# Patient Record
Sex: Female | Born: 1960 | Race: White | Hispanic: No
Health system: Western US, Academic
[De-identification: ages and names within clinical notes are randomized; demographics above are authoritative.]

## PROBLEM LIST (undated history)

## (undated) DIAGNOSIS — K439 Ventral hernia without obstruction or gangrene: Secondary | ICD-10-CM

## (undated) DIAGNOSIS — F419 Anxiety disorder, unspecified: Secondary | ICD-10-CM

## (undated) DIAGNOSIS — N6019 Diffuse cystic mastopathy of unspecified breast: Secondary | ICD-10-CM

## (undated) DIAGNOSIS — D1800 Hemangioma unspecified site: Secondary | ICD-10-CM

## (undated) DIAGNOSIS — T8130XA Disruption of wound, unspecified, initial encounter: Secondary | ICD-10-CM

## (undated) DIAGNOSIS — R7611 Nonspecific reaction to tuberculin skin test without active tuberculosis: Secondary | ICD-10-CM

## (undated) DIAGNOSIS — R011 Cardiac murmur, unspecified: Secondary | ICD-10-CM

## (undated) DIAGNOSIS — J45909 Unspecified asthma, uncomplicated: Secondary | ICD-10-CM

## (undated) DIAGNOSIS — I499 Cardiac arrhythmia, unspecified: Secondary | ICD-10-CM

## (undated) DIAGNOSIS — K5792 Diverticulitis of intestine, part unspecified, without perforation or abscess without bleeding: Secondary | ICD-10-CM

## (undated) DIAGNOSIS — I2510 Atherosclerotic heart disease of native coronary artery without angina pectoris: Secondary | ICD-10-CM

## (undated) DIAGNOSIS — T148XXA Other injury of unspecified body region, initial encounter: Secondary | ICD-10-CM

## (undated) DIAGNOSIS — K449 Diaphragmatic hernia without obstruction or gangrene: Secondary | ICD-10-CM

## (undated) DIAGNOSIS — K219 Gastro-esophageal reflux disease without esophagitis: Secondary | ICD-10-CM

## (undated) DIAGNOSIS — M1990 Unspecified osteoarthritis, unspecified site: Secondary | ICD-10-CM

## (undated) DIAGNOSIS — F1910 Other psychoactive substance abuse, uncomplicated: Secondary | ICD-10-CM

## (undated) DIAGNOSIS — E785 Hyperlipidemia, unspecified: Secondary | ICD-10-CM

## (undated) DIAGNOSIS — I509 Heart failure, unspecified: Secondary | ICD-10-CM

## (undated) DIAGNOSIS — F32A Depression, unspecified: Secondary | ICD-10-CM

## (undated) DIAGNOSIS — E669 Obesity, unspecified: Secondary | ICD-10-CM

## (undated) DIAGNOSIS — F5101 Primary insomnia: Secondary | ICD-10-CM

## (undated) DIAGNOSIS — T300 Burn of unspecified body region, unspecified degree: Secondary | ICD-10-CM

## (undated) DIAGNOSIS — R519 Headache, unspecified: Secondary | ICD-10-CM

## (undated) DIAGNOSIS — I2699 Other pulmonary embolism without acute cor pulmonale: Secondary | ICD-10-CM

## (undated) HISTORY — DX: Other injury of unspecified body region, initial encounter: T14.8XXA

## (undated) HISTORY — DX: Heart failure, unspecified: I50.9

## (undated) HISTORY — DX: Ventral hernia without obstruction or gangrene: K43.9

## (undated) HISTORY — DX: Anxiety disorder, unspecified: F41.9

## (undated) HISTORY — DX: Other psychoactive substance abuse, uncomplicated: F19.10

## (undated) HISTORY — DX: Headache, unspecified: R51.9

## (undated) HISTORY — PX: PR LIG/TRNSXJ FLP TUBE ABDL/VAG APPR UNI/BI: 58600

## (undated) HISTORY — DX: Diffuse cystic mastopathy of unspecified breast: N60.19

## (undated) HISTORY — PX: PR UNLISTED CARDIAC SURGERY: 33999

## (undated) HISTORY — DX: Hyperlipidemia, unspecified: E78.5

## (undated) HISTORY — DX: Gastro-esophageal reflux disease without esophagitis: K21.9

## (undated) HISTORY — DX: Diaphragmatic hernia without obstruction or gangrene: K44.9

## (undated) HISTORY — DX: Burn of unspecified body region, unspecified degree: T30.0

## (undated) HISTORY — DX: Primary insomnia: F51.01

## (undated) HISTORY — DX: Nonspecific reaction to tuberculin skin test without active tuberculosis: R76.11

## (undated) HISTORY — DX: Hemangioma unspecified site: D18.00

## (undated) HISTORY — DX: Cardiac arrhythmia, unspecified: I49.9

## (undated) HISTORY — DX: Other pulmonary embolism without acute cor pulmonale: I26.99

## (undated) HISTORY — DX: Diverticulitis of intestine, part unspecified, without perforation or abscess without bleeding: K57.92

## (undated) HISTORY — DX: Obesity, unspecified: E66.9

## (undated) HISTORY — DX: Cardiac murmur, unspecified: R01.1

## (undated) HISTORY — DX: Depression, unspecified: F32.A

## (undated) HISTORY — DX: Atherosclerotic heart disease of native coronary artery without angina pectoris: I25.10

## (undated) HISTORY — DX: Unspecified osteoarthritis, unspecified site: M19.90

## (undated) HISTORY — PX: HERNIA REPAIR: SHX5222

## (undated) HISTORY — DX: Disruption of wound, unspecified, initial encounter: T81.30XA

## (undated) HISTORY — PX: PR NM CFS LEAK: 6874

## (undated) HISTORY — DX: Unspecified asthma, uncomplicated: J45.909

---

## 2007-04-22 ENCOUNTER — Encounter (HOSPITAL_COMMUNITY): Payer: Medicare Other | Admitting: Surgery

## 2007-04-22 ENCOUNTER — Ambulatory Visit (EMERGENCY_DEPARTMENT_HOSPITAL): Payer: Medicare Other

## 2007-04-22 DIAGNOSIS — W401XXA Explosion of explosive gases, initial encounter: Secondary | ICD-10-CM

## 2007-04-22 DIAGNOSIS — T310 Burns involving less than 10% of body surface: Secondary | ICD-10-CM

## 2007-04-22 DIAGNOSIS — T24209A Burn of second degree of unspecified site of unspecified lower limb, except ankle and foot, initial encounter: Secondary | ICD-10-CM

## 2007-05-16 ENCOUNTER — Encounter (HOSPITAL_BASED_OUTPATIENT_CLINIC_OR_DEPARTMENT_OTHER): Payer: Medicare Other

## 2007-05-16 DIAGNOSIS — T300 Burn of unspecified body region, unspecified degree: Secondary | ICD-10-CM

## 2007-05-26 ENCOUNTER — Ambulatory Visit (HOSPITAL_BASED_OUTPATIENT_CLINIC_OR_DEPARTMENT_OTHER): Payer: Medicare Other

## 2007-06-25 ENCOUNTER — Ambulatory Visit (HOSPITAL_BASED_OUTPATIENT_CLINIC_OR_DEPARTMENT_OTHER): Payer: Medicare Other

## 2007-06-25 DIAGNOSIS — T24299A Burn of second degree of multiple sites of unspecified lower limb, except ankle and foot, initial encounter: Secondary | ICD-10-CM

## 2007-12-24 ENCOUNTER — Ambulatory Visit (HOSPITAL_BASED_OUTPATIENT_CLINIC_OR_DEPARTMENT_OTHER): Payer: Medicare Other | Admitting: Surgery

## 2008-04-12 ENCOUNTER — Encounter (INDEPENDENT_AMBULATORY_CARE_PROVIDER_SITE_OTHER): Payer: Medicaid Other

## 2008-05-12 ENCOUNTER — Ambulatory Visit (INDEPENDENT_AMBULATORY_CARE_PROVIDER_SITE_OTHER): Payer: Medicaid Other

## 2008-05-26 ENCOUNTER — Ambulatory Visit (INDEPENDENT_AMBULATORY_CARE_PROVIDER_SITE_OTHER): Payer: Medicaid Other

## 2010-08-01 ENCOUNTER — Other Ambulatory Visit: Payer: Self-pay

## 2012-09-20 ENCOUNTER — Other Ambulatory Visit: Payer: Self-pay

## 2016-10-31 ENCOUNTER — Other Ambulatory Visit: Payer: Self-pay

## 2018-02-13 ENCOUNTER — Telehealth (INDEPENDENT_AMBULATORY_CARE_PROVIDER_SITE_OTHER): Payer: Self-pay | Admitting: Surgery
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
## 2018-02-25 ENCOUNTER — Encounter (INDEPENDENT_AMBULATORY_CARE_PROVIDER_SITE_OTHER): Payer: Self-pay | Admitting: Surgery

## 2018-02-25 ENCOUNTER — Ambulatory Visit (INDEPENDENT_AMBULATORY_CARE_PROVIDER_SITE_OTHER): Payer: Medicare Other | Admitting: Surgery

## 2018-02-25 VITALS — BP 126/71 | Temp 91.0°F | Ht 66.0 in | Wt 238.0 lb

## 2018-02-25 DIAGNOSIS — Z6838 Body mass index (BMI) 38.0-38.9, adult: Secondary | ICD-10-CM

## 2018-02-25 DIAGNOSIS — J449 Chronic obstructive pulmonary disease, unspecified: Secondary | ICD-10-CM | POA: Insufficient documentation

## 2018-02-25 DIAGNOSIS — Z951 Presence of aortocoronary bypass graft: Secondary | ICD-10-CM | POA: Insufficient documentation

## 2018-02-25 DIAGNOSIS — F3341 Major depressive disorder, recurrent, in partial remission: Secondary | ICD-10-CM | POA: Insufficient documentation

## 2018-02-25 DIAGNOSIS — Z9889 Other specified postprocedural states: Secondary | ICD-10-CM | POA: Insufficient documentation

## 2018-02-25 DIAGNOSIS — E069 Thyroiditis, unspecified: Secondary | ICD-10-CM | POA: Insufficient documentation

## 2018-02-25 DIAGNOSIS — F411 Generalized anxiety disorder: Secondary | ICD-10-CM | POA: Insufficient documentation

## 2018-02-25 DIAGNOSIS — E785 Hyperlipidemia, unspecified: Secondary | ICD-10-CM | POA: Insufficient documentation

## 2018-02-25 DIAGNOSIS — K432 Incisional hernia without obstruction or gangrene: Secondary | ICD-10-CM

## 2018-02-25 DIAGNOSIS — I10 Essential (primary) hypertension: Secondary | ICD-10-CM | POA: Insufficient documentation

## 2018-02-25 DIAGNOSIS — F17200 Nicotine dependence, unspecified, uncomplicated: Secondary | ICD-10-CM | POA: Insufficient documentation

## 2018-02-25 DIAGNOSIS — K5900 Constipation, unspecified: Secondary | ICD-10-CM | POA: Insufficient documentation

## 2018-02-25 DIAGNOSIS — Z7901 Long term (current) use of anticoagulants: Secondary | ICD-10-CM | POA: Insufficient documentation

## 2018-02-25 DIAGNOSIS — I2782 Chronic pulmonary embolism: Secondary | ICD-10-CM | POA: Insufficient documentation

## 2018-02-25 DIAGNOSIS — K439 Ventral hernia without obstruction or gangrene: Secondary | ICD-10-CM | POA: Insufficient documentation

## 2018-02-25 HISTORY — DX: Chronic obstructive pulmonary disease, unspecified: J44.9

## 2018-02-25 HISTORY — DX: Nicotine dependence, unspecified, uncomplicated: F17.200

## 2018-02-25 HISTORY — DX: Ventral hernia without obstruction or gangrene: K43.9

## 2018-02-25 HISTORY — DX: Hyperlipidemia, unspecified: E78.5

## 2018-02-25 NOTE — Progress Notes (Signed)
I, Seward Grater MD,interviewed and examined the patient while overseeing the documentation performed by my Medical Scribe, Amy Block. I have reviewedand revised as necessarythe scribes note andagree withthe documented findings and plan of care.Please refer to the scribe's note above for further detailed information regarding the patient encounter and exam. 02/25/2018 1:17 PM    Sharion Balloon of [facility] [place] [facility]  [address] [place] [address]  [phone] (telephone)  [phone] (fax)

## 2018-02-25 NOTE — Patient Instructions (Signed)
Hernia (Adult)    A hernia can happen when there is a weakness or defect in the wall of the abdomen or groin.Intestines or nearby tissues may move from their usual location and push through the weakness in the wall. This can cause a hernia (bulge) you may see or feel.  Causes and Risk Factors  A hernia may be present at birth. Or it may be caused by the wear and tear of daily living. Certain factors can make a hernia more likely. These can include:   Heavy lifting   Straining, whether from lifting, movement, or constipation   Chronic cough   Injury to the abdominal wall   Excess weight   Pregnancy   Prior surgery   Older age   Family history of hernia  Symptoms  Symptoms of a hernia may come on suddenly. Or they may appear slowly over time. Some common symptoms include:   Bulge in the groin area, around the navel, or in the scrotum (the bulge may get bigger when you stand and go away when you lie down)   Pain or pressure around the bulge   Pain during activities such as lifting, coughing, or sneezing   A feeling of weakness or pressure in the groin   Pain or swelling in the scrotum  Types of hernias  There are different types of hernia. The type you have depends on its location:   Inguinal: This type is in the groin or scrotum. It is more common in men.   Femoral: This type is in the groin, upper thigh (where the leg bends), or labia. It is more common in women.   Ventral: This type is in the abdominal wall.   Umbilical: This type occurs around the navel (belly button).   Incisional: This type occurs at the site of a previous surgery.  The condition of the hernia can help determine how urgently it needs to be treated.   Reducible: It goes back in by itself, or it can be pushed back in.   Irreducible: It cant be pushed back in.   Incarcerated/Strangulated: The intestine is trapped (incarcerated). If this happens, you wont be able to push the bulge back in. If the incarcerated hernia isnt  treated, it may become strangulated. This means the area loses blood supply and the tissue may die.This requires emergency surgery! Treatment is needed right away!  In most cases, a hernia will not heal on its own. Surgery is usually needed to repair the defect in the abdominal wall or groin. Youll be told more about surgery, if needed.  If your symptoms are not severe, treatment may sometimes be delayed. In such cases, regular follow-up visits with the provider will be needed. Youll be asked to keep track of your symptoms and to watch for signs of more serious problems. You may also be given guidelines similar to the home care instructions below.  Home Care  To help keep a hernia from getting worse, you may be advised to:   Avoid heavy lifting and straining as directed.   Take steps to prevent constipation, such as eating more fiber and drinking more water. This may help reduce straining that can occur when having a bowel movement. Reducing straining may help keep your symptoms from getting worse.   Maintain a healthy weight or lose excess weight. This can help reduce strain on abdominal muscles and tissues.   Stop smoking. This can help prevent coughing that may also strain abdominal muscles and tissues.  Follow-up care  Follow up with your healthcare provider, or as directed.If imaging tests were done, they will be reviewed a doctor. You will be told the results and any new findings that may affect your care.  When to seek medical advice  Call your healthcare provider right away if any of these occur:   Hernia hardens, swells, or grows larger   Hernia can no longer be pushed back in   Pain moves to the lower right abdomen (just below the waistline), or spreads to the back  Call 911  Call 911 right away if any of these occur:   Nausea and vomiting   Severe pain, redness, or tenderness in the area near the hernia   Pain worsens quickly and doesnt get better   Inability to have a bowel movement or  pass gas   Fever of 100.36F (38C) or higher   Trouble breathing   Fainting   Rapid heart rate   Vomiting blood   Large amounts of blood in stool  Date Last Reviewed: 03/23/2014   2000-2017 [employer]. [address], [place] [address]. All rights reserved. This information is not intended as a substitute for professional medical care. Always follow your healthcare professional's instructions.

## 2018-02-25 NOTE — Progress Notes (Deleted)
GENERAL SURGERY OUTPATIENT CONSULTATION    CHIEF COMPLAINT: No chief complaint on file.       Kathleen Chambers is a 57 year old female referred by {REFERRAL SOURCE:104691} [facility] for evaluation of ventral hernia.    HISTORY OF PRESENT ILLNESS:  Kathleen Chambers is a 57 year old female who presents with ***     Outside records were reviewed.  Relevant workup to date is summarized below:  Labs: ***   Endoscopy: ***  Imaging: ***  Pathology: ***  Operative reports: ***  Other studies: ***    Past Medical History:   No past medical history on file.   There is no problem list on file for this patient.        Past Surgical History:   No past surgical history on file.     Family and Social History:   family history is not on file.   Social History     Tobacco Use    Smoking status: Not on file   Substance Use Topics    Alcohol use: Not on file    Drug use: Not on file         Active Meds:   No current outpatient medications on file.     No current facility-administered medications for this visit.          Allergies:   Review of patient's allergies indicates:  Allergies not on file   Review of Systems:   ROS:   Constitutional: {ROS - CONSTITUTIONAL:102793::"Negative "}   Eyes: {HCFAEYE:103488::"Negative "}   Ears, Nose, Mouth, Throat: {ROS - EARS/NOSE/THROAT:103489::"Negative "}   Cardiovascular: {ROS - CARDIOVASCULAR:103490::"Negative "}   Respiratory: {ROS - RESPIRATORY:103491::"Negative "}   Gastrointestinal: {ROS - GASTROENTESTINAL:103492::"Negative"}   Genitourinary: {ROS - GENITOURINARY:103493::"Negative"}   Musculoskeletal: {ROS - MUSCULOSKELETAL:103501::"Negative "}   Skin: {ROS - SKIN:103502::"Negative "}   Neurological: {ROS - NEUROLOGICAL:103505::"Negative "}   Psychiatric: {ROS - PSYCH:103504::"Negative "}   Endocrine: {ROS - ENDOCRINE:103902::"Negative "}   Hematologic/Lymphatic: {ROS - HEMATOLOGIC/LYMPHATIC:103506::"Negative"}   Allergic/Immunologic: {ROS - ALLERGIC/IMMUNOLOGIC:103507::"Negative "}      Physical  Exam:   There were no vitals taken for this visit.  General: {GENERAL APPEARANCE:50::"healthy","alert","no distress"}  Skin: {SKIN EXAM:101::"Skin color, texture, turgor normal. No rashes or concerning lesions"}  Head: {HEAD EXAM:301::"Normocephalic. No masses, lesions, tenderness or abnormalities"}  Eyes: {EYE EXAM:201::"Lids/periorbital skin normal","Conjunctivae/corneas clear","PERRL","EOM's intact"}  Ears: {EAR EXAM:322::"External ears normal. Canals clear. TM's normal."}  Nose:{NOSE BRIEF EXAM:316::"normal"}  Oropharynx: {OROPHARYNX EXAM:303::"Lips, mucosa, and tongue normal. Teeth and gums normal.","posterior pharynx without erythema or drainage"}  Neck: {NECK EXAM:304::"supple. No adenopathy. Thyroid symmetric, normal size, without nodules"}  Lungs: {LUNGS BRIEF EXAM:404::"clear to auscultation"}  Heart: {HEART BRIEF EXAM:513::"normal rate, regular rhythm","no murmurs, clicks, or gallops"}  Back: {BACK EXAM:801::"Back symmetric, no deformity; ROM normal; No CVA tenderness."}  Abd: {ABDOMEN EXAM:601::"soft, non-tender. BS normal. No masses or organomegaly"}   Rectal: {RECTAL - INSPECTION:103795::"No hemorrhoids, no skin lesions, normal sphincter tone, no masses."}     ASSESSMENT:  Kathleen Chambers is a 57 year old female with ***    PLAN:   1. ***  2. Follow-up plan: ***       [place] of [facility] [place] [facility]  [address] [place] [address]  [phone] (telephone)  [phone] (fax)      Scribe Attestation:    Portions of this chart were written by Karoline Caldwell, Medical Scribe, on 02/25/2018  with oversight by Dr. Roselee Nova, MD.

## 2018-02-25 NOTE — Telephone Encounter (Addendum)
Called pt to retrieve all information on surgeries/operations since 2010. No answer, left vm to callback. Only known surgeries so far;    2012- Lumbar infusion by Dr. Rona Ravens x 2 (Need medical record)    2015 - Incisional hernia mesh insertion performed at [facility] in [place], [place]. By Dr. Donneta Romberg (Faxed request for medical record)    2017 - Incisional hernia mesh repair at [facility]. [place]. By [place] (Records faxed over)
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
## 2018-02-26 NOTE — Telephone Encounter (Signed)
LVMTCB.    Levester Fresh, MD  [facility] Clinical Support Staff Pool            Hello,     I need all the operative reports since 2010 for this patient please.

## 2018-02-27 NOTE — Telephone Encounter (Addendum)
LVMTCB. Received fax back from [facility] stating there were no OP notes for this pt from 2015.     Kathleen Fresh, MD  [facility] Clinical Support Staff Pool             Hello,     I need all the operative reports since 2010 for this patient please.

## 2018-05-27 ENCOUNTER — Encounter (INDEPENDENT_AMBULATORY_CARE_PROVIDER_SITE_OTHER): Payer: Medicare Other | Admitting: Surgery

## 2018-06-09 ENCOUNTER — Telehealth (INDEPENDENT_AMBULATORY_CARE_PROVIDER_SITE_OTHER): Payer: Self-pay | Admitting: Surgery

## 2018-06-09 ENCOUNTER — Encounter (INDEPENDENT_AMBULATORY_CARE_PROVIDER_SITE_OTHER): Payer: Medicare Other | Admitting: Surgery

## 2018-06-09 ENCOUNTER — Ambulatory Visit: Payer: Self-pay

## 2018-06-09 NOTE — Telephone Encounter (Signed)
Patient called today to add a note to her chart that she is having trouble catching her breath when she walks, is in bed and when she is standing. She experiences relief when she lays on her side. Additionally she has a hoarse voice and trouble with bowl movements.     Patient has already spoken to the community care line through the [facility] for triaging for her symptoms and is aware to present to the ED or urgent care as needed.     Patient is scheduled for an appointment on 06/17/2018. Patient declined earlier appointment due to ride scheduling.

## 2018-06-09 NOTE — Progress Notes (Deleted)
GENERAL SURGERY OUTPATIENT FOLLOW-UP    CHIEF COMPLAINT: No chief complaint on file.       Last Clinic Visit: ***      HISTORY OF PRESENT ILLNESS:  Kathleen Chambers is a 57 year old female who presents for follow up of her large incisional hernia.    Outside records were reviewed.  Relevant workup to date is summarized below:  Labs: ***   Endoscopy: ***  Imaging: ***  Pathology: ***  Operative reports: ***  Other studies: ***    I have *** reviewed the patient's past medical history, family history, medications, allergies, and social history.    ROS: Complete ROS {was/was not:103378} performed and all other systems negative except as in HPI.     PHYSICAL EXAM:   There were no vitals taken for this visit.  General: {GENERAL APPEARANCE:50::"healthy","alert","no distress"}  Skin: {SKIN EXAM:101::"Skin color, texture, turgor normal. No rashes or concerning lesions"}  Head: {HEAD EXAM:301::"Normocephalic. No masses, lesions, tenderness or abnormalities"}  Eyes: {EYE EXAM:201::"Lids/periorbital skin normal","Conjunctivae/corneas clear","PERRL","EOM's intact"}  Ears: {EAR EXAM:322::"External ears normal. Canals clear. TM's normal."}  Nose:{NOSE BRIEF EXAM:316::"normal"}  Oropharynx: {OROPHARYNX EXAM:303::"Lips, mucosa, and tongue normal. Teeth and gums normal.","posterior pharynx without erythema or drainage"}  Neck: {NECK EXAM:304::"supple. No adenopathy. Thyroid symmetric, normal size, without nodules"}  Lungs: {LUNGS BRIEF EXAM:404::"clear to auscultation"}  Heart: {HEART BRIEF EXAM:513::"normal rate, regular rhythm","no murmurs, clicks, or gallops"}  Back: {BACK EXAM:801::"Back symmetric, no deformity; ROM normal; No CVA tenderness."}  Abd: {ABDOMEN EXAM:601::"soft, non-tender. BS normal. No masses or organomegaly"}   Rectal: {RECTAL - INSPECTION:103795::"No hemorrhoids, no skin lesions, normal sphincter tone, no masses."}     ASSESSMENT:  Kathleen Chambers is a 57 year old female with ***    PLAN:   1. ***  2. Follow-up  plan: ***       [place] of [facility] and [place] and [facility]  [address], [place] [address]  [phone] (telephone)  [phone] (fax)      Scribe Attestation:    Portions of this chart were written by Karoline Caldwell, Medical Scribe, on 06/09/2018 with oversight by Dr. Roselee Nova, MD.

## 2018-06-09 NOTE — Telephone Encounter (Signed)
Reason for Disposition   MODERATE longstanding difficulty breathing (e.g., speaks in phrases, SOB even at rest, pulse 100-120) and SAME as normal    Protocols used: BREATHING DIFFICULTY-ADULT-OH    Pt calling to speak with [facility] surgical team. And/or make an appt. She has an abd hernia that is pressing on her diaphram making it difficult to get full breath, especially when lying down. Denies SOB enough to go to ED or 911. Pt has CHF, COPD and knows what that feels like, able to speak in full sentences, no wheezing.

## 2018-06-09 NOTE — Telephone Encounter (Signed)
(  TEXTING IS AN OPTION FOR [facility] ONLY)  Is this a [facility] clinic? No      RETURN CALL: Detailed message on voicemail only      SUBJECT:  Form/Letter/Paperwork Request     REASON FOR REQUEST: transportation     FORM/LETTER/PAPERWORK IS FOR: Other: [employer] (transportation)  WHO SHOULD FILL IT OUT?: Dr Roselee Nova  WILL PATIENT PICK UP?: Please fax to [phone]  NEEDED BY: as soon as possible (scheduled 9/3 for follow up)  ADDITIONAL INFORMATION: patient has been unable to arrange transportation due to PCP sending insufficient information for the need of her transportation from [place] to [facility] for her follow up with Dr Roselee Nova. A letter from Dr Roselee Nova explaining the medical necessity for the ride may help.     Please call [employer] at [phone] to request the form needed and ascertain the information they need in the letter to confirm the patient's ride.     Please call patient to follow up, details on voicemail are okay.

## 2018-06-17 ENCOUNTER — Encounter (INDEPENDENT_AMBULATORY_CARE_PROVIDER_SITE_OTHER): Payer: Medicare Other | Admitting: Surgery

## 2018-07-08 ENCOUNTER — Ambulatory Visit (INDEPENDENT_AMBULATORY_CARE_PROVIDER_SITE_OTHER): Payer: Medicare Other | Admitting: Surgery

## 2018-07-08 VITALS — BP 136/66 | Ht 66.0 in | Wt 238.0 lb

## 2018-07-08 DIAGNOSIS — K432 Incisional hernia without obstruction or gangrene: Secondary | ICD-10-CM

## 2018-07-08 DIAGNOSIS — Z6838 Body mass index (BMI) 38.0-38.9, adult: Secondary | ICD-10-CM

## 2018-07-08 NOTE — Progress Notes (Signed)
GENERAL SURGERY OUTPATIENT FOLLOW-UP    CHIEF COMPLAINT: Follow Up Visit       Last Clinic Visit: 02/25/18    Recap from prior visit:  Kathleen Chambers is a 57 year old female referred by another physician: Dr. Tobie Poet for evaluation of her incisional hernia. She first noticed symptoms 9 years ago located in the upper epigastric region.     She describes the pain associated with her hernia as  intermittent without radiation. It is associated with a a large visible bulge and some occasional nausea Risk factors for increased intra-abdominal pressure include smoking and obesity. The patient doesn't have a history of MRSA, and has had negative nasal swabs in the past.    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 Patient subsequently had surgeries to place mesh in the abdomen, reportedly performed by her CT sugeron, and a surgery with Dr. Radene Knee years later to remove her infect abdominal mesh. She reports that she had been walking to a post-op visit when her chest started "pumping blood out," which prompted a visit to the ED. She had a large blood clot over the abdominal mesh that led to a hematoma and infection. Her abdominal wound remained open for 3 years before finally closing.    She reports that she does not perform many daily activities due to her not being able to lift anything. She also has shortness of breath, worse with exertion, that she attributes to smoking half a pack of cigarettes a day. Patient is on an anticoagulant. She endorses nausea at night when she goes to move positions that feels like a "tearing." She endorses episodes of the hernia turning red and becoming hard before she had her abdominal mesh removed, but none since then.    Patient takes coumadin for her mitral valve repair. She has previously used bridging shots in the past. She denies any diabetes. 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 Patient has not had a  colonoscopy. She reports that she heard she was at a higher risk and would require upper-and-lower endoscopies in the hospital, but she is unclear on when she would eventually get a colonoscopy.    Her sister currently has had lung cancer for 6 years and is not doing well. Both her parents died of lung cancer, and she has other relatives with heart disease.      ECOG performance status: 2      She has had 1 attempt at repair. Repair(s) have consisted of one onlay mesh repair     Functional Status: Independent     Employment type: None     Sports activity: None    Hypertension? yes  Liver disease? no          Ascites? no  Dyspnea? yes               COPD?  yes  Diabetic? No                 Hemoglobin A1c (in last 6 months)? No   Anticoagulated? Yes                 Antiplatelet meds (aspirin, Plavix, Effient, Ticlid)?No     To be held for surgery? No  Immunosuppressants (steroids, antimetabolites, antibody, anti-rejection)? No  History of IBD? No  History of AAA? No  Previous attempts at repair = 1.  Component separation?: no  History of open abdomen (fascia and skin not closed after surgery)? yes   History of abdominal wall surgical site infection? Yes Superficial   Current, active infection?  no  History of MRSA infection? yes      HISTORY OF PRESENT ILLNESS:    Kathleen Chambers is a 57 year old female who returns today for further evaluation of her hernia, a weight check, and check with smoking cessation. There is good news and bad news, the bad news is that her weight loss has not been as good as hoped. She is eating well and trying to exercise, but she has been developing worsening dyspnea and leg swelling. She takes lasix daily, but still has pedal edema. She tells me that she is getting some PFT's done soon. She is feeling more constipated lately, she had to switch to Linzess to help with her bowel function, but it is not working as well as she had hoped. She has been able to greatly reduce her  smoking. In 3 months, she has gone from 2 ppd to 5 cigarettes a day. She is very happy with this result. Otherwise, nothing else has really changed for her. No new nausea or vomiting.    Outside records were reviewed. Relevant workup to date is summarized below:    Labs: NA   Endoscopy: NA  Imaging: NA  Pathology: NA  Operative reports: NA  Other studies: Na    I have personally reviewed the patient's past medical history, family history, medications, allergies, and social history.    ROS: Complete ROS was performed and all other systems negative except as in HPI.    PHYSICAL EXAM:   BP 136/66    Ht 5\' 6"  (1.676 m)    Wt (!) 238 lb (108 kg)    BMI 38.41 kg/m   General: healthy, alert, no distress  Skin: Skin color, texture, turgor normal. No rashes or concerning lesions  Head: Normocephalic. No masses, lesions, tenderness or abnormalities  Eyes: Lids/periorbital skin normal, Conjunctivae/corneas clear, PERRL, EOM's intact  Ears: External ears normal. Canals clear. TM's normal.  Nose:normal  Oropharynx: Lips, mucosa, and tongue normal. Teeth and gums normal., posterior pharynx without erythema or drainage  Neck: supple. No adenopathy. Thyroid symmetric, normal size, without nodules  Lungs: clear to auscultation  Heart: normal rate, regular rhythm and no murmurs, clicks, or gallops  Back: Back symmetric, no deformity; ROM normal; No CVA tenderness.  Abd:  soft, non-tender, with large epigastric hernia extending from her xyphoid process to is tender to palpation. Irreducible secondary to size. Skin is healed over but thin.    Rectal: Deferred.  ?  ASSESSMENT:  Kathleen Chambers is a 57 year old female with a large ventral hernia  PLAN:   1. She needs to continue with weight loss and smoking cessation. She is improving, but we cannot rush this issue. She should follow up with her PCP re: her PFT to make sure she will be a safe candidate for surgery.   2. Follow-up plan: 2 months  ?  [place]  of [facility] and [facility]  Surgical Services and [facility]  [address], [place] [address]  [phone] (telephone)  [phone] (fax)  ?

## 2018-09-03 NOTE — Progress Notes (Signed)
GENERAL SURGERY OUTPATIENT FOLLOW-UP    CHIEF COMPLAINT: Follow Up Visit (Abd hernia f/u, no surgery yet)       Last Clinic Visit: 07/08/2018    Recap from last visit:  Patient initially presented on 02/25/2018 for evaluation of an incisional hernia that she noticed 9 years ago located in the upper epigastric region. She endorsed associated intermittent pain, large visible bulge and occasional nausea. Risk factors for increased intra-abdominal pressure includesmoking and obesity.    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 Patient subsequently had surgeries to place mesh in the abdomen, reportedly performed by her CT surgeon, and a surgery with Dr. Radene Knee years later to remove her infect abdominal mesh. She reports that she had been walking to a post-op visit when her chest started "pumping blood out," which prompted a visit to the ED. She had a large blood clot over the abdominal mesh that led to a hematoma and infection. Her abdominal wound remained open for 3 years before finally closing.    Patient takes coumadin for her mitral valve repair and has previously used bridging shots in the past.  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 She denies any diabetes.Patient has not had a colonoscopy.     Her sister currently has had lung cancer for 6 years and is not doing well. Both her parents died of lung cancer, and she has other relatives with heart disease.    ECOG performance status:2    She has had1attempt at repair. Repair(s) have consisted of one onlay mesh repair    Functional Status:Independent    Employment type:None    Sports activity:None    Hypertension?yes  Liver disease?no  Ascites?no  Dyspnea?yes  COPD?yes  Diabetic?No  Hemoglobin A1c (in last 6 months)?No  Anticoagulated?Yes  Antiplatelet meds (aspirin, Plavix, Effient, Ticlid)?No  To be  held for surgery?No  Immunosuppressants (steroids, antimetabolites, antibody, anti-rejection)?No  History of IBD?No  History of AAA?No  Previous attempts at repair =1.   Component separation?:no  History of open abdomen (fascia and skin not closed after surgery)?yes  History of abdominal wall surgical site infection?YesSuperficial   Current, active infection?no  History of MRSA infection?yes    She again seen on 07/08/2018 when she reported that she developed worsening dyspnea and leg swelling despite taking lasix daily. She also endorsed constipation and being placed on Linzess for her bowel function which has not been helping. She did report that she was able to reduce her smoking from 2 ppd to 5 cigarettes per day. At the time, we discussed following with her PFT as planned with her PCP and continue with weight loss goals and smoking cessation.     HISTORY OF PRESENT ILLNESS:  Kathleen Chambers is a 57 year old female with incisional hernia who returns today for weight check and check on smoking cessation.    She states that she has been gaining weight after trying to quit smoking, which she has been reduce down to 5 cigarettes per day. She states that it is difficult to reduce her smoking below this and she has moody affects. She believes that she will be able to quit smoking completely before Christmas. She denies any recent nausea. She endorses constipation. 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 She is still interested in surgery to  repair her hernia.    I have  reviewed the patient's past medical history, family history, medications, allergies, and social history.    ROS: Complete ROS was performed and all other systems negative except as in HPI.     PHYSICAL EXAM:   BP 119/64    Pulse 76    Ht 5\' 6"  (1.676 m)    Wt (!) 251 lb 3.2 oz (113.9 kg) Comment: Pt in wheelchair   BMI 40.54 kg/m   General:  Obese, alert, no distress  Skin: Skin color, texture, turgor normal. No rashes or concerning lesions  Head: Normocephalic. No masses, lesions, tenderness or abnormalities  Eyes: Lids/periorbital skin normal, Conjunctivae/corneas clear, PERRL, EOM's intact  Ears: External ears normal. Canals clear. TM's normal.  Nose:normal  Oropharynx: Lips, mucosa, and tongue normal. Teeth and gums normal., posterior pharynx without erythema or drainage  Neck: supple. No adenopathy. Thyroid symmetric, normal size, without nodules  Lungs: clear to auscultation  Heart: normal rate, regular rhythm and no murmurs, clicks, or gallops  Back: Back symmetric, no deformity; ROM normal; No CVA tenderness.  Abd: soft, non-tender, with large epigastric hernia extending from her xyphoid process tois tender to palpation. Irreducible secondary to size. Skin is healed over but thin.   Rectal: Deferred      ASSESSMENT:  [name] is a 57 year old female with incisional hernia.    PLAN:   1. I recommended patient to continue with her smoking cessation and weight loss goal of 10 lbs, but to focus more on quitting smoking.  2. I discussed with the patient that she may be sent to a skilled nursing facility after surgery temporarily if the physical therapist at our hospital thinks that she will have difficulty ambulating after surgery.  3. I advised her to call her cardiologist to see if she will need to be seen at cardiology clinic for clearance for surgery.  4. Follow-up plan: Return for another check up in January when we will schedule surgery if she has successfully quit smoking completely.         [place] of [facility] and [facility]  Surgical Services and [facility]  [address], [place] [address]  [phone] (telephone)  [phone] (fax)

## 2018-09-04 ENCOUNTER — Ambulatory Visit (INDEPENDENT_AMBULATORY_CARE_PROVIDER_SITE_OTHER): Payer: Medicare Other | Admitting: Surgery

## 2018-09-04 VITALS — BP 119/64 | HR 76 | Ht 66.0 in | Wt 251.2 lb

## 2018-09-04 DIAGNOSIS — Z6841 Body Mass Index (BMI) 40.0 and over, adult: Secondary | ICD-10-CM

## 2018-09-04 DIAGNOSIS — K432 Incisional hernia without obstruction or gangrene: Secondary | ICD-10-CM

## 2018-09-04 NOTE — Progress Notes (Signed)
I, Seward Grater MD, interviewed and examined the patient while overseeing the documentation performed by my Medical Scribe, Ju Oh. I have reviewed and revised as necessary the scribe's note and agree with the documented findings and plan of care. Please refer to the scribe's note above for further detailed information regarding the patient encounter and exam. 09/04/2018 1:24 PM    [place] Balloon of [facility] and [facility]  Surgical Services and [facility]  [address], [place] [address]  [phone] (telephone)  [phone] (fax)

## 2018-10-30 ENCOUNTER — Encounter (INDEPENDENT_AMBULATORY_CARE_PROVIDER_SITE_OTHER): Payer: Medicare Other | Admitting: Surgery

## 2018-11-12 NOTE — Progress Notes (Signed)
GENERAL SURGERY OUTPATIENT FOLLOW-UP    CHIEF COMPLAINT: Follow Up Visit (Abdominal hernia. More problems going to the bathroom. )       Last Clinic Visit: 09/04/2018    Recap of History:  Patient initially presented on 02/25/2018 for evaluation of an incisional hernia that she noticed 9 years ago located in the upper epigastric region. She endorsed associated intermittent pain, large visible bulge and occasional nausea. Risk factors for increased intra-abdominal pressure includesmoking and obesity.    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 Patient subsequently had surgeries to place mesh in the abdomen, reportedly performed by her CT surgeon, and a surgery with Dr. Radene Knee years later to remove her infect abdominal mesh. She reports that she had been walking to a post-op visit when her chest started "pumping blood out," which prompted a visit to the ED. She had a large blood clot over the abdominal mesh that led to a hematoma and infection. Her abdominal wound remained open for 3 years before finally closing.    Patient takes coumadin for her mitral valve repair and has previously used bridging shots in the past.  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 She denies any diabetes.Patient has not had a colonoscopy.     Her sister currently has had lung cancer for 6 years and is not doing well. Both her parents died of lung cancer, and she has other relatives with heart disease.    ECOG performance status:2    She has had1attempt at repair. Repair(s) have consisted of one onlay mesh repair    Functional Status:Independent    Employment type:None    Sports activity:None    Hypertension?yes  Liver disease?no  Ascites?no  Dyspnea?yes  COPD?yes  Diabetic?No  Hemoglobin A1c (in last 6 months)?No  Anticoagulated?Yes  Antiplatelet meds (aspirin, Plavix, Effient,  Ticlid)?No  To be held for surgery?No  Immunosuppressants (steroids, antimetabolites, antibody, anti-rejection)?No  History of IBD?No  History of AAA?No  Previous attempts at repair =1.   Component separation?:no  History of open abdomen (fascia and skin not closed after surgery)?yes  History of abdominal wall surgical site infection?YesSuperficial   Current, active infection?no  History of MRSA infection?yes    She again seen on 07/08/2018 when she reported that she developed worsening dyspnea and leg swelling despite taking lasix daily. She also endorsed constipation and being placed on Linzess for her bowel function which has not been helping. She did report that she was able to reduce her smoking from 2 ppd to 5 cigarettes per day. At the time, we discussed following with her PFT as planned with her PCP and continue with weight loss goals and smoking cessation.     On 09/04/2018, patient returned to clinic for another check up on her weight loss and smoking cessation efforts. At the time, patient reported the she could not further reduce her smoking from 5 cigarettes a day. Patient was also advised to call her cardiologist to see if she will need to be seen for surgery clearance.    HISTORY OF PRESENT ILLNESS:  Kathleen Chambers is a 58 year old female who returns today for weight check and further discussion of her surgical plans.    Today, patient reports that she has been able to quit smoking completely since first of this month. However, patient's weight is now 262 lbs which is increased from 251 lbs measured at  last visit. She continues to have discomfort from her hernia and she is hoping to have it repaired as soon as possible.    I have reviewed the patient's past medical history, family history, medications, allergies, and social history.    ROS: Complete ROS was performed and all other systems negative except as in HPI.     PHYSICAL EXAM:   BP 124/64   Ht 5\' 6"  (1.676 m)   Wt (!)  262 lb (118.8 kg)   BMI 42.29 kg/m    General: Morbidly obese, alert, no distress  Skin: Skin color, texture, turgor normal. No rashes or concerning lesions  Head: Normocephalic. No masses, lesions, tenderness or abnormalities  Eyes: Lids/periorbital skin normal, Conjunctivae/corneas clear, PERRL, EOM's intact  Ears: External ears normal. Canals clear. TM's normal.  Nose:normal  Oropharynx: Lips, mucosa, and tongue normal. Teeth and gums normal., posterior pharynx without erythema or drainage  Neck: supple. No adenopathy. Thyroid symmetric, normal size, without nodules  Lungs: clear to auscultation  Heart: normal rate, regular rhythm and no murmurs, clicks, or gallops  Back: Back symmetric, no deformity; ROM normal; No CVA tenderness.  Abd: soft, non-tender, with large epigastric hernia extending from her xyphoid process tois tender to palpation. Irreducible secondary to size. Skin is healed over but thin.  Rectal: Deferred     ASSESSMENT:  Kathleen Chambers is a 58 year old female with an incisional hernia.    PLAN:   1. Again I discussed with the patient that an open surgery would be the best option for her hernia, which is not possible without having her BMI down to at least 35. She can undergo a robotic surgery at any time, but patient would like to continue with weight loss efforts so that she can have an open surgery. I am happy that patient was able to quit smoking and I encouraged her to continue with this and also work on her weight loss.  2. Follow-up plan: Return in 2 months for reassessment.       [place] of [facility] and [facility]  [address] [place] [address]  [phone] (telephone)  [phone] (fax)    Scribe Attestation:  Portions of this note were documented by Rebecca Eaton, medical scribe, on 11/13/2018, with oversight by Dr. Roselee Nova.

## 2018-11-13 ENCOUNTER — Ambulatory Visit (INDEPENDENT_AMBULATORY_CARE_PROVIDER_SITE_OTHER): Payer: Medicare Other | Admitting: Surgery

## 2018-11-13 VITALS — BP 124/64 | Ht 66.0 in | Wt 262.0 lb

## 2018-11-13 DIAGNOSIS — K432 Incisional hernia without obstruction or gangrene: Secondary | ICD-10-CM

## 2018-11-13 DIAGNOSIS — Z6841 Body Mass Index (BMI) 40.0 and over, adult: Secondary | ICD-10-CM

## 2018-11-13 NOTE — Progress Notes (Signed)
I, Seward Grater MD, interviewed and examined the patient while overseeing the documentation performed by my Medical Scribe, Ju Oh. I have reviewed and revised as necessary the scribe's note and agree with the documented findings and plan of care. Please refer to the scribe's note above for further detailed information regarding the patient encounter and exam. 11/13/2018 1:04 PM    [place] Balloon of [facility] and [facility]  [address] [place] [address]  [phone] (telephone)  [phone] (fax)

## 2019-01-08 ENCOUNTER — Telehealth (INDEPENDENT_AMBULATORY_CARE_PROVIDER_SITE_OTHER): Payer: Medicare Other | Admitting: Surgery

## 2019-04-13 ENCOUNTER — Inpatient Hospital Stay: Payer: Self-pay

## 2019-04-14 ENCOUNTER — Inpatient Hospital Stay: Payer: Self-pay

## 2019-07-09 ENCOUNTER — Inpatient Hospital Stay: Payer: Self-pay

## 2019-07-14 ENCOUNTER — Inpatient Hospital Stay: Payer: Self-pay

## 2019-12-22 ENCOUNTER — Inpatient Hospital Stay: Payer: Self-pay

## 2020-01-10 ENCOUNTER — Emergency Department: Payer: Self-pay

## 2020-01-14 DEATH — deceased

## 2023-11-09 IMAGING — MR LOMBAR
4 of 8 series · 19 of 48 positions shown · non-contrast
Comparison: none

[Series 4: T2 · coronal · 6.0mm · 0.59mm/px · 4 of 12 slices shown (1 of 3)]
[im 1/12]
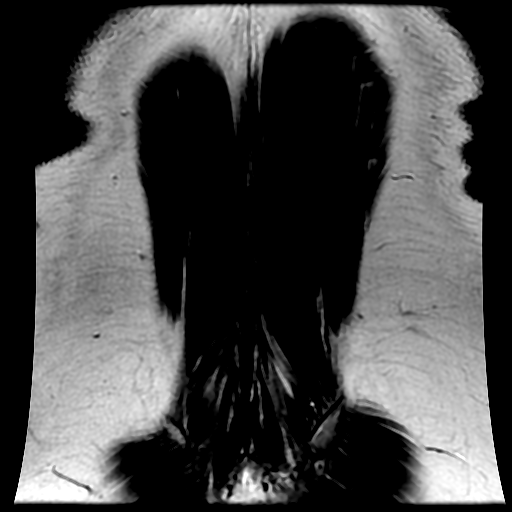
[im 4/12]
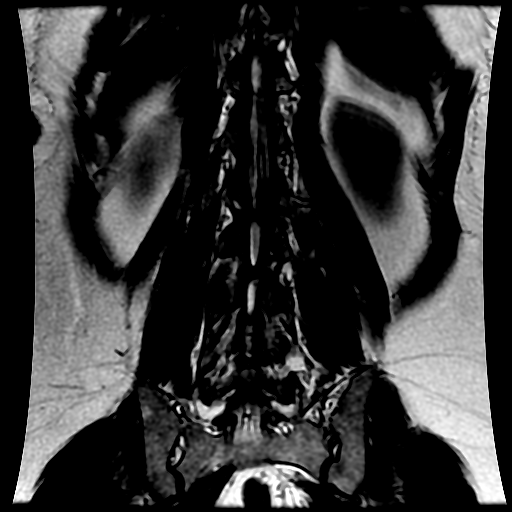
[im 8/12]
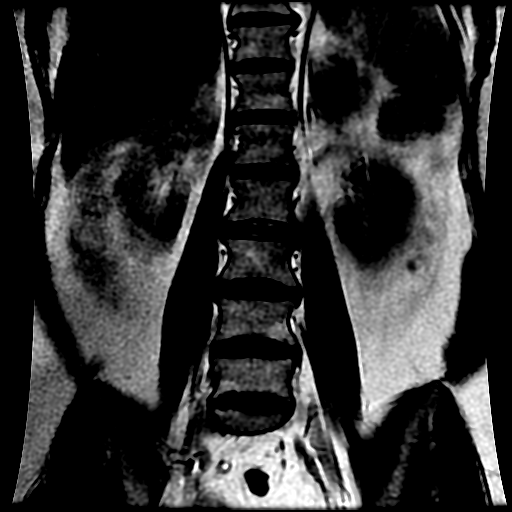
[im 12/12]
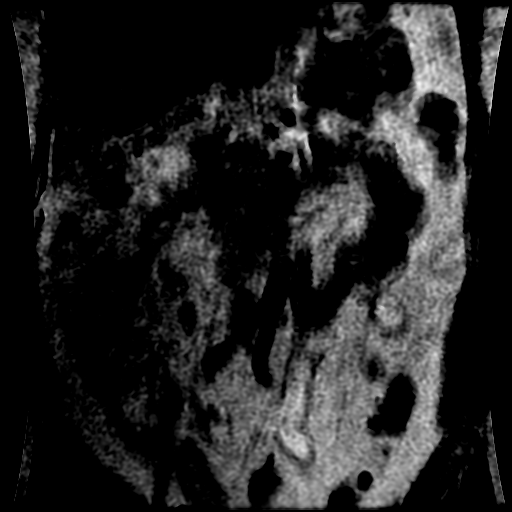

[Series 5: T2 · sagittal · 3.7mm · 0.59mm/px · 5 of 15 slices shown (2 of 3)]
[im 1/15]
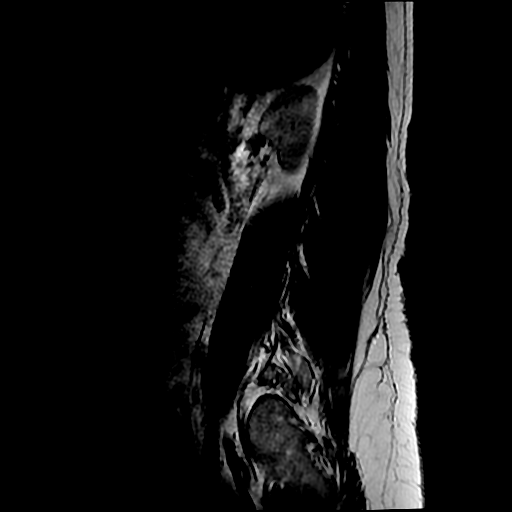
[im 4/15]
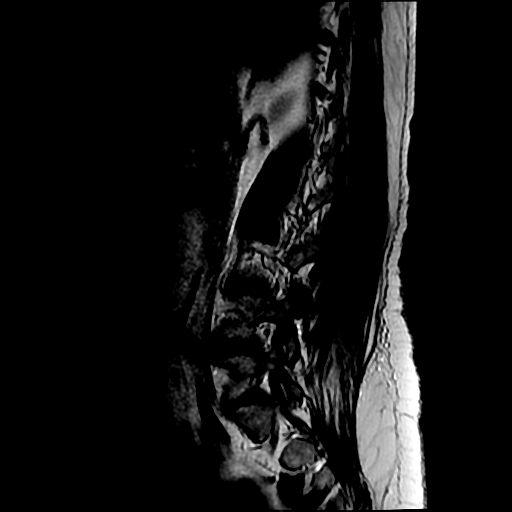
[im 8/15]
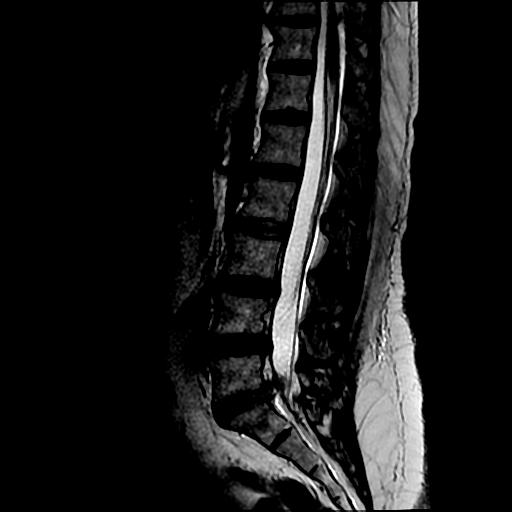
[im 11/15]
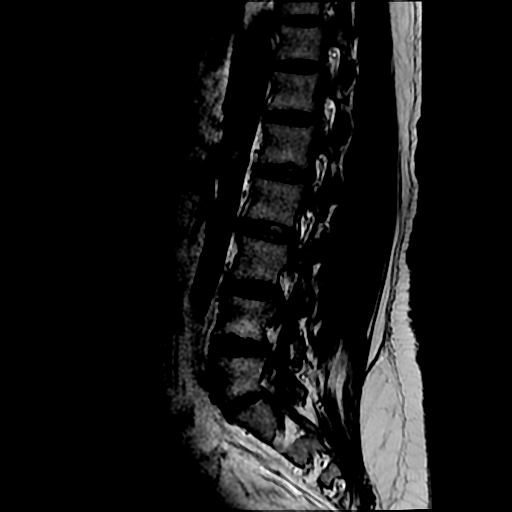
[im 15/15]
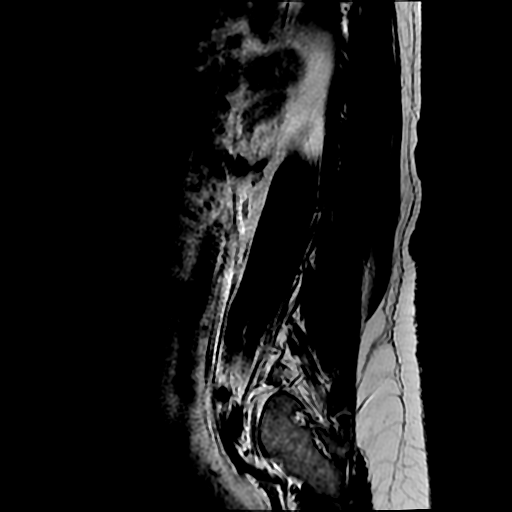

[Series 6: T1 · sagittal · 3.7mm · 0.59mm/px · 3 of 15 slices shown]
[im 1/15]
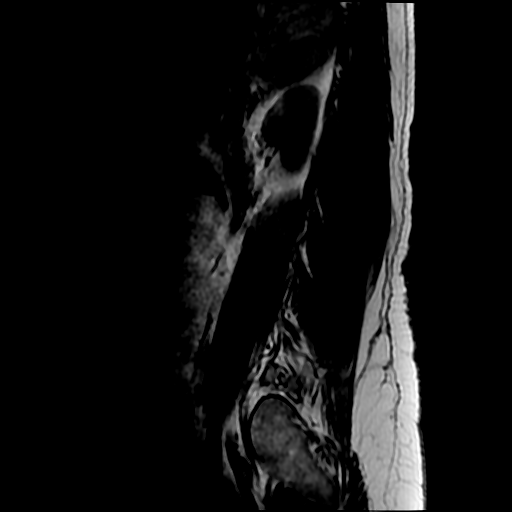
[im 8/15]
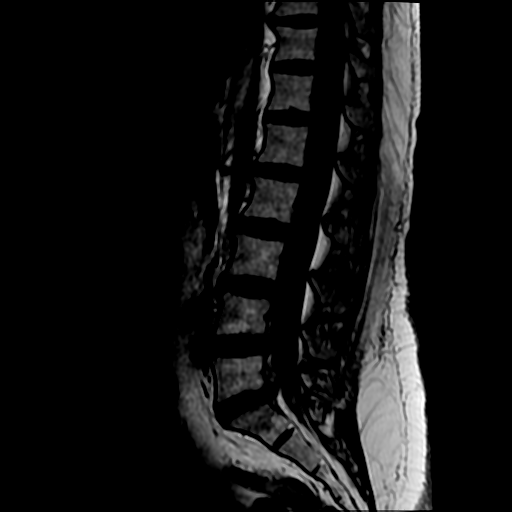
[im 15/15]
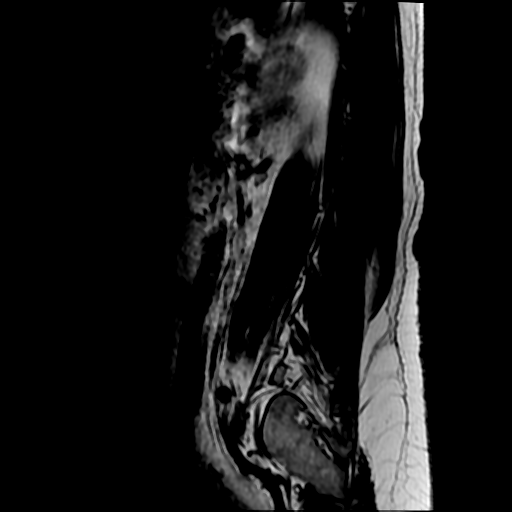

[Series 8: T2 · axial · 4.0mm · 0.39mm/px · z∈[-152,+41]mm · 7 of 25 slices shown (3 of 3)]
[im 1/25]
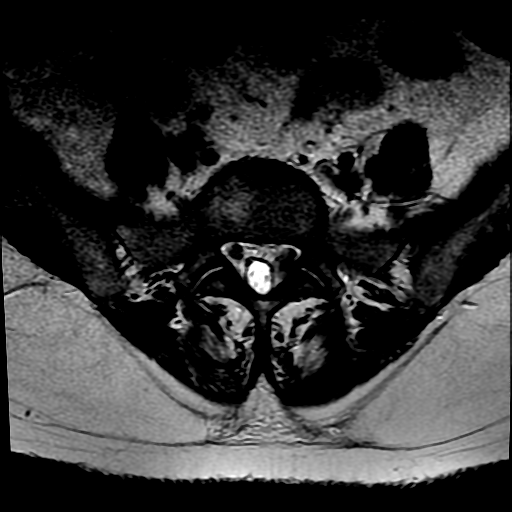
[im 4/25]
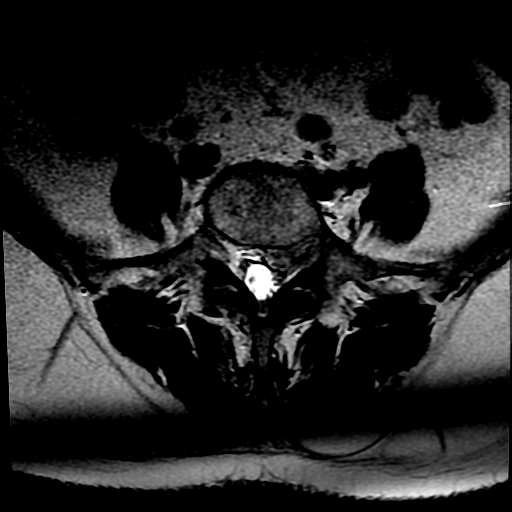
[im 7/25]
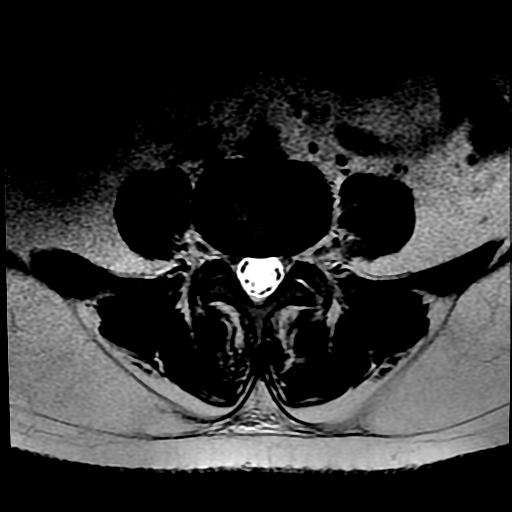
[im 10/25]
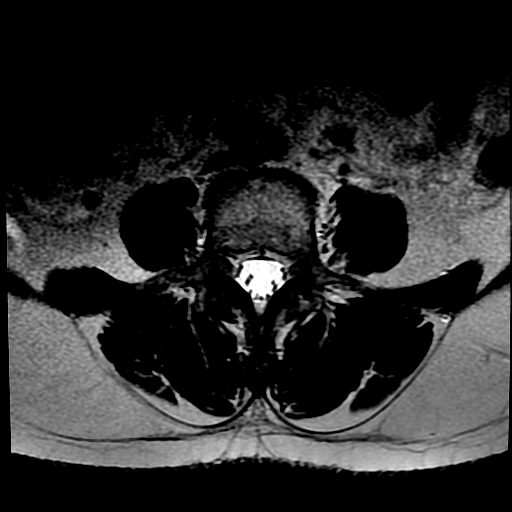
[im 13/25]
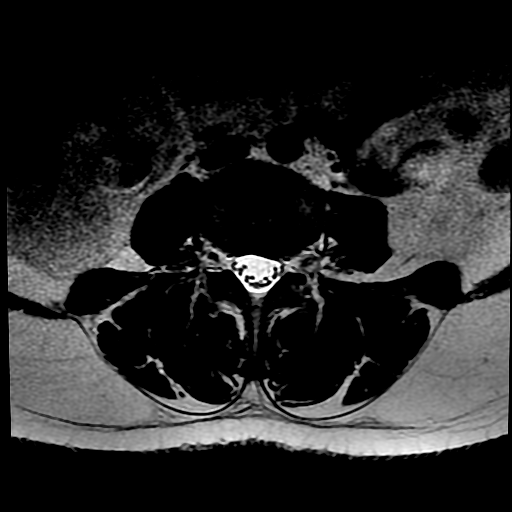
[im 16/25]
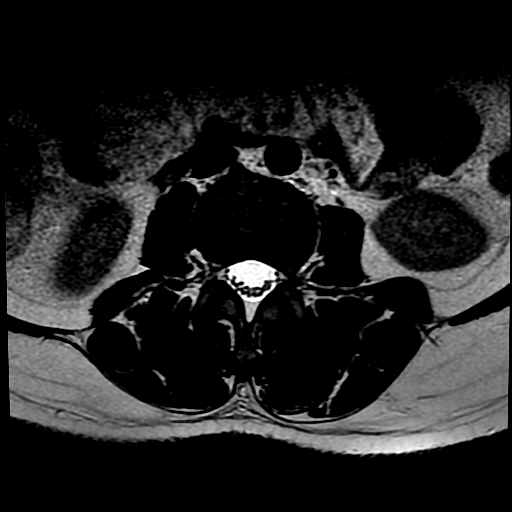
[im 22/25]
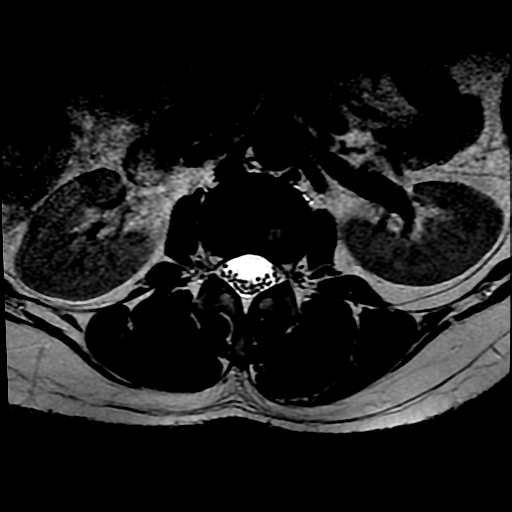

[19 of 48 positions shown; findings below may reference images not displayed]

RESSONÂNCIA MAGNÉTICA DA COLUNA LOMBOSSACRA

TÉCNICA:
Exame realizado em equipamento de ressonância magnética com sequências, ponderações e planos específicos para o segmento de interesse, sem a administração endovenosa do meio de contraste.

RESULTADO:
Bom alinhamento dos corpos vertebrais lombares.
Acentuação da lordose lombar em decúbito.
Corpos vertebrais com alturas preservadas.
Osteofitose nos corpos vertebrais lombares.
Pedículos, lâminas, processos transversos e espinhosos preservados.
Articulações interapofisárias preservadas.
Alteração degenerativa tipo Modic II (degeneração gordurosa) nos planaltos vertebrais apostos de L4-L5 e L5-S1.
Nódulos de Schmorl nos planaltos vertebrais apostos de L4-L5 e L5-S1 e inferior de D12.
Sinais de desidratação discal em L1-L2, L3-L4, L4-L5 e L5-S1.

Nível L3-L4: Protrusão discal paramediana esquerda, apresentando fissura do ânulo fibroso, determinando compressão na face ventral do saco dural, sem conflitos radiculares.
Nível L4-L5: Abaulamento discal simétrico, comprimindo a face ventral do saco dural, sem conflitos radiculares.
Nível L5-S1: Extrusão discal subarticular esquerda com migração cranial infrapedicular pedicular e fissura do ânulo fibroso, determinando compressão na face ventral do saco dural e na raiz descendente esquerda de S1.

Demais discos intervertebrais sem alterações significativas.
Canal vertebral e forames intervertebrais com amplitude preservada nos demais segmentos.
Espaço liquórico livre nos demais segmentos.
Demais raízes nervosas foraminais preservadas.
Cone medular com contornos regulares e sinal homogêneo.
Partes moles paravertebrais com aspecto preservado.

CONCLUSÃO:
Espondilose lombar.
Discopatia degenerativa acima descrita.

## 5967-03-16 DEATH — deceased
